# Patient Record
Sex: Female | Born: 1965 | Race: White | Hispanic: No | Marital: Married | Smoking: Current every day smoker
Health system: Southern US, Community
[De-identification: ages and names within clinical notes are randomized; demographics above are authoritative.]

## PROBLEM LIST (undated history)

## (undated) DIAGNOSIS — Z8481 Family history of carrier of genetic disease: Secondary | ICD-10-CM

## (undated) DIAGNOSIS — S0300XA Dislocation of jaw, unspecified side, initial encounter: Secondary | ICD-10-CM

## (undated) DIAGNOSIS — Z800 Family history of malignant neoplasm of digestive organs: Secondary | ICD-10-CM

## (undated) DIAGNOSIS — Z8042 Family history of malignant neoplasm of prostate: Secondary | ICD-10-CM

## (undated) DIAGNOSIS — E785 Hyperlipidemia, unspecified: Secondary | ICD-10-CM

## (undated) DIAGNOSIS — Z803 Family history of malignant neoplasm of breast: Secondary | ICD-10-CM

## (undated) HISTORY — DX: Family history of malignant neoplasm of prostate: Z80.42

## (undated) HISTORY — DX: Dislocation of jaw, unspecified side, initial encounter: S03.00XA

## (undated) HISTORY — DX: Family history of malignant neoplasm of digestive organs: Z80.0

## (undated) HISTORY — PX: KNEE SURGERY: SHX244

## (undated) HISTORY — DX: Hyperlipidemia, unspecified: E78.5

## (undated) HISTORY — PX: TUBAL LIGATION: SHX77

## (undated) HISTORY — DX: Family history of carrier of genetic disease: Z84.81

## (undated) HISTORY — DX: Family history of malignant neoplasm of breast: Z80.3

## (undated) HISTORY — PX: ENDOMETRIAL ABLATION: SHX621

---

## 2007-08-19 ENCOUNTER — Emergency Department (HOSPITAL_COMMUNITY): Admission: EM | Admit: 2007-08-19 | Discharge: 2007-08-19 | Payer: Self-pay | Admitting: Emergency Medicine

## 2010-12-05 ENCOUNTER — Inpatient Hospital Stay (INDEPENDENT_AMBULATORY_CARE_PROVIDER_SITE_OTHER)
Admission: RE | Admit: 2010-12-05 | Discharge: 2010-12-05 | Disposition: A | Discharge status: Home / Self Care | Payer: Self-pay | Source: Ambulatory Visit | Attending: Family Medicine | Admitting: Family Medicine

## 2010-12-05 DIAGNOSIS — S139XXA Sprain of joints and ligaments of unspecified parts of neck, initial encounter: Secondary | ICD-10-CM

## 2010-12-11 ENCOUNTER — Inpatient Hospital Stay (INDEPENDENT_AMBULATORY_CARE_PROVIDER_SITE_OTHER)
Admission: RE | Admit: 2010-12-11 | Discharge: 2010-12-11 | Disposition: A | Discharge status: Home / Self Care | Payer: Self-pay | Source: Ambulatory Visit | Attending: Emergency Medicine | Admitting: Emergency Medicine

## 2010-12-11 DIAGNOSIS — M26609 Unspecified temporomandibular joint disorder, unspecified side: Secondary | ICD-10-CM

## 2011-03-25 LAB — POCT URINALYSIS DIP (DEVICE)
Bilirubin Urine: NEGATIVE
Glucose, UA: NEGATIVE
Hgb urine dipstick: NEGATIVE
Ketones, ur: NEGATIVE
Nitrite: NEGATIVE
Operator id: 247071
Protein, ur: NEGATIVE
Specific Gravity, Urine: <=1.005
Urobilinogen, UA: 0.2
pH: 7

## 2011-08-07 ENCOUNTER — Other Ambulatory Visit (HOSPITAL_COMMUNITY): Payer: Self-pay | Admitting: Neurosurgery

## 2011-08-07 DIAGNOSIS — M5030 Other cervical disc degeneration, unspecified cervical region: Secondary | ICD-10-CM

## 2011-08-07 DIAGNOSIS — M542 Cervicalgia: Secondary | ICD-10-CM

## 2011-08-07 DIAGNOSIS — M5412 Radiculopathy, cervical region: Secondary | ICD-10-CM

## 2011-08-07 DIAGNOSIS — M47812 Spondylosis without myelopathy or radiculopathy, cervical region: Secondary | ICD-10-CM

## 2011-08-08 ENCOUNTER — Ambulatory Visit (HOSPITAL_COMMUNITY)
Admission: RE | Admit: 2011-08-08 | Discharge: 2011-08-08 | Disposition: A | Discharge status: Home / Self Care | Payer: 59 | Source: Ambulatory Visit | Attending: Neurosurgery | Admitting: Neurosurgery

## 2011-08-08 DIAGNOSIS — M5030 Other cervical disc degeneration, unspecified cervical region: Secondary | ICD-10-CM

## 2011-08-08 DIAGNOSIS — M47812 Spondylosis without myelopathy or radiculopathy, cervical region: Secondary | ICD-10-CM

## 2011-08-08 DIAGNOSIS — M5020 Other cervical disc displacement, unspecified cervical region: Secondary | ICD-10-CM | POA: Insufficient documentation

## 2011-08-08 DIAGNOSIS — M542 Cervicalgia: Secondary | ICD-10-CM

## 2011-08-08 DIAGNOSIS — M5412 Radiculopathy, cervical region: Secondary | ICD-10-CM

## 2012-10-12 ENCOUNTER — Ambulatory Visit: Payer: Self-pay | Admitting: Emergency Medicine

## 2012-10-14 ENCOUNTER — Emergency Department: Payer: Self-pay | Admitting: Emergency Medicine

## 2012-10-14 LAB — CBC WITH DIFFERENTIAL/PLATELET
Basophil #: 0 10*3/uL (ref 0.0–0.1)
Basophil %: 0.2 %
Eosinophil #: 0.1 10*3/uL (ref 0.0–0.7)
Eosinophil %: 0.8 %
HCT: 43.3 % (ref 35.0–47.0)
HGB: 14.8 g/dL (ref 12.0–16.0)
Lymphocyte #: 0.9 10*3/uL — ABNORMAL LOW (ref 1.0–3.6)
Lymphocyte %: 10.1 %
MCH: 31.3 pg (ref 26.0–34.0)
MCHC: 34.1 g/dL (ref 32.0–36.0)
MCV: 92 fL (ref 80–100)
Monocyte #: 0.9 x10 3/mm (ref 0.2–0.9)
Monocyte %: 9.7 %
Neutrophil #: 7.3 10*3/uL — ABNORMAL HIGH (ref 1.4–6.5)
Neutrophil %: 79.2 %
Platelet: 298 10*3/uL (ref 150–440)
RBC: 4.72 10*6/uL (ref 3.80–5.20)
RDW: 12.8 % (ref 11.5–14.5)
WBC: 9.3 10*3/uL (ref 3.6–11.0)

## 2012-10-14 LAB — BASIC METABOLIC PANEL
Anion Gap: 8 (ref 7–16)
BUN: 5 mg/dL — ABNORMAL LOW (ref 7–18)
Calcium, Total: 8.9 mg/dL (ref 8.5–10.1)
Chloride: 103 mmol/L (ref 98–107)
Co2: 21 mmol/L (ref 21–32)
Creatinine: 0.77 mg/dL (ref 0.60–1.30)
EGFR (African American): >60
EGFR (Non-African Amer.): >60
Glucose: 90 mg/dL (ref 65–99)
Osmolality: 261 (ref 275–301)
Potassium: 4.1 mmol/L (ref 3.5–5.1)
Sodium: 132 mmol/L — ABNORMAL LOW (ref 136–145)

## 2015-08-30 LAB — HM PAP SMEAR: HM Pap smear: NEGATIVE

## 2015-12-01 DIAGNOSIS — Z0000 Encounter for general adult medical examination without abnormal findings: Secondary | ICD-10-CM | POA: Diagnosis not present

## 2015-12-08 DIAGNOSIS — E669 Obesity, unspecified: Secondary | ICD-10-CM | POA: Diagnosis not present

## 2015-12-08 DIAGNOSIS — E785 Hyperlipidemia, unspecified: Secondary | ICD-10-CM | POA: Diagnosis not present

## 2015-12-08 DIAGNOSIS — N920 Excessive and frequent menstruation with regular cycle: Secondary | ICD-10-CM | POA: Diagnosis not present

## 2015-12-08 DIAGNOSIS — Z0000 Encounter for general adult medical examination without abnormal findings: Secondary | ICD-10-CM | POA: Diagnosis not present

## 2015-12-08 DIAGNOSIS — Z6835 Body mass index (BMI) 35.0-35.9, adult: Secondary | ICD-10-CM | POA: Diagnosis not present

## 2015-12-08 DIAGNOSIS — Z23 Encounter for immunization: Secondary | ICD-10-CM | POA: Diagnosis not present

## 2015-12-21 DIAGNOSIS — E785 Hyperlipidemia, unspecified: Secondary | ICD-10-CM | POA: Diagnosis not present

## 2015-12-22 DIAGNOSIS — Z1231 Encounter for screening mammogram for malignant neoplasm of breast: Secondary | ICD-10-CM | POA: Diagnosis not present

## 2016-01-17 DIAGNOSIS — N920 Excessive and frequent menstruation with regular cycle: Secondary | ICD-10-CM | POA: Diagnosis not present

## 2016-01-17 DIAGNOSIS — E669 Obesity, unspecified: Secondary | ICD-10-CM | POA: Diagnosis not present

## 2016-01-17 DIAGNOSIS — E785 Hyperlipidemia, unspecified: Secondary | ICD-10-CM | POA: Diagnosis not present

## 2016-01-17 DIAGNOSIS — Z720 Tobacco use: Secondary | ICD-10-CM | POA: Diagnosis not present

## 2016-01-19 DIAGNOSIS — N945 Secondary dysmenorrhea: Secondary | ICD-10-CM | POA: Diagnosis not present

## 2016-01-19 DIAGNOSIS — D251 Intramural leiomyoma of uterus: Secondary | ICD-10-CM | POA: Diagnosis not present

## 2016-05-28 DIAGNOSIS — R208 Other disturbances of skin sensation: Secondary | ICD-10-CM | POA: Diagnosis not present

## 2016-05-28 DIAGNOSIS — M25562 Pain in left knee: Secondary | ICD-10-CM | POA: Diagnosis not present

## 2016-06-17 DIAGNOSIS — M25562 Pain in left knee: Secondary | ICD-10-CM | POA: Diagnosis not present

## 2017-04-27 DIAGNOSIS — M2669 Other specified disorders of temporomandibular joint: Secondary | ICD-10-CM | POA: Diagnosis not present

## 2017-05-21 ENCOUNTER — Ambulatory Visit: Payer: Self-pay | Admitting: Primary Care

## 2017-06-16 ENCOUNTER — Ambulatory Visit: Payer: Self-pay | Admitting: Primary Care

## 2017-06-26 ENCOUNTER — Ambulatory Visit (INDEPENDENT_AMBULATORY_CARE_PROVIDER_SITE_OTHER): Payer: BLUE CROSS/BLUE SHIELD | Admitting: Primary Care

## 2017-06-26 ENCOUNTER — Encounter: Payer: Self-pay | Admitting: Primary Care

## 2017-06-26 VITALS — BP 122/82 | HR 86 | Temp 98.0°F | Ht 66.0 in | Wt 226.0 lb

## 2017-06-26 DIAGNOSIS — M26609 Unspecified temporomandibular joint disorder, unspecified side: Secondary | ICD-10-CM | POA: Diagnosis not present

## 2017-06-26 DIAGNOSIS — E785 Hyperlipidemia, unspecified: Secondary | ICD-10-CM | POA: Insufficient documentation

## 2017-06-26 MED ORDER — GABAPENTIN 300 MG PO CAPS: 300.0000 mg | ORAL_CAPSULE | Freq: Every day | ORAL | 0 refills | Status: DC

## 2017-06-26 MED ORDER — BACLOFEN 10 MG PO TABS: 5.0000 mg | ORAL_TABLET | Freq: Every day | ORAL | 0 refills | Status: DC

## 2017-06-26 NOTE — Assessment & Plan Note (Signed)
Will complete lipids with upcoming CPE as she is not fasting today.

## 2017-06-26 NOTE — Patient Instructions (Signed)
I sent refills for gabapentin and baclofen to your pharmacy.  Please follow up with the dentist as discussed.  Please schedule a physical with me at your convenience in 2019. You may also schedule a lab only appointment 3-4 days prior. We will discuss your lab results in detail during your physical.  It was a pleasure to meet you today! Please don't hesitate to call or message me with any questions. Welcome to [facility]!

## 2017-06-26 NOTE — Assessment & Plan Note (Signed)
Diagnosed years ago. Recommend she get established with a dentist and/or oral surgeon. She doesn't wish to pursue surgical intervention at this time.  Will refill baclofen and gabapentin for now until she can get established with a local dentist.

## 2017-06-26 NOTE — Progress Notes (Signed)
Subjective:    Patient ID: Brandi Cochran, female    DOB: 10-03-1965, 51 y.o.   MRN: [id number]  HPI  Brandi Cochran is a 51 year old female who presents today to establish care and discuss the problems mentioned below. Will obtain old records. Her last physical was one year ago.   1) TMJ: Diagnosed in 2005, by her dentist. She once had an appliance made for her TMJ. Pain is located to the right jaw, around her ear with radiation to her cheek. She also reports numbness to the right lateral side of her face. She's been grinding her teeth since age 34. She is wearing an OTC appliance at this time.  She presented to the [facility] in November 2018 due to increased pain and an inability to open her mouth. She was diagnosed with TMJ and was prescribed gabapentin 300 mg HS and baclofen 10 mg HS. She ran out of her baclofen, still has gabapentin. She's noticed significant improvement on both medications.   2) Hyperlipidemia: Diagnosed in 2017. Underwent carotid ultrasound for a bruit that was noticed on exam. Ultrasound was clear. She is not currently managed on medication. She is not fasting today.   Review of Systems  Constitutional: Negative for fever.  HENT: Negative for facial swelling.        Jaw pain  Respiratory: Negative for shortness of breath.   Cardiovascular: Negative for chest pain.  Neurological: Negative for headaches.       Past Medical History:  Diagnosis Date  . Hyperlipidemia   . TMJ (dislocation of temporomandibular joint)      Social History   Socioeconomic History  . Marital status: Divorced    Spouse name: Not on file  . Number of children: Not on file  . Years of education: Not on file  . Highest education level: Not on file  Social Needs  . Financial resource strain: Not on file  . Food insecurity - worry: Not on file  . Food insecurity - inability: Not on file  . Transportation needs - medical: Not on file  . Transportation needs -  non-medical: Not on file  Occupational History  . Not on file  Tobacco Use  . Smoking status: Current Every Day Smoker  . Smokeless tobacco: Never Used  Substance and Sexual Activity  . Alcohol use: No    Frequency: Never  . Drug use: Not on file  . Sexual activity: Not on file  Other Topics Concern  . Not on file  Social History Narrative   Married.   4 children, one grandchildren.   Works as a Engineer, petroleum.    Enjoys doing puzzles, playing computer games, aromatherapy.      Family History  Problem Relation Age of Onset  . Breast cancer Mother 60  . Hypertension Father   . Heart attack Father     No Known Allergies  No current outpatient medications on file prior to visit.   No current facility-administered medications on file prior to visit.     BP 122/82   Pulse 86   Temp 98 F (36.7 C) (Oral)   Ht 5\' 6"  (1.676 m)   Wt 226 lb (102.5 kg)   SpO2 98%   BMI 36.48 kg/m    Objective:   Physical Exam  Constitutional: She appears well-nourished.  Neck: Neck supple.  Cardiovascular: Normal rate and regular rhythm.  Pulmonary/Chest: Effort normal and breath sounds normal.  Skin: Skin is warm  and dry.  Psychiatric: She has a normal mood and affect.          Assessment & Plan:

## 2017-07-10 ENCOUNTER — Other Ambulatory Visit: Payer: Self-pay | Admitting: Primary Care

## 2017-07-10 DIAGNOSIS — E785 Hyperlipidemia, unspecified: Secondary | ICD-10-CM

## 2017-07-15 ENCOUNTER — Other Ambulatory Visit (INDEPENDENT_AMBULATORY_CARE_PROVIDER_SITE_OTHER): Payer: BLUE CROSS/BLUE SHIELD

## 2017-07-15 DIAGNOSIS — E785 Hyperlipidemia, unspecified: Secondary | ICD-10-CM

## 2017-07-15 LAB — LIPID PANEL
Cholesterol: 235 mg/dL — ABNORMAL HIGH (ref 0–200)
HDL: 48 mg/dL (ref >39.00)
LDL Cholesterol: 160 mg/dL — ABNORMAL HIGH (ref 0–99)
NonHDL: 186.93
Total CHOL/HDL Ratio: 5
Triglycerides: 137 mg/dL (ref 0.0–149.0)
VLDL: 27.4 mg/dL (ref 0.0–40.0)

## 2017-07-15 LAB — COMPREHENSIVE METABOLIC PANEL
ALT: 15 U/L (ref 0–35)
AST: 17 U/L (ref 0–37)
Albumin: 4.1 g/dL (ref 3.5–5.2)
Alkaline Phosphatase: 99 U/L (ref 39–117)
BUN: 8 mg/dL (ref 6–23)
CO2: 27 mEq/L (ref 19–32)
Calcium: 9 mg/dL (ref 8.4–10.5)
Chloride: 104 mEq/L (ref 96–112)
Creatinine, Ser: 0.63 mg/dL (ref 0.40–1.20)
GFR: 105.67 mL/min (ref >60.00)
Glucose, Bld: 90 mg/dL (ref 70–99)
Potassium: 4.3 mEq/L (ref 3.5–5.1)
Sodium: 137 mEq/L (ref 135–145)
Total Bilirubin: 0.6 mg/dL (ref 0.2–1.2)
Total Protein: 7.1 g/dL (ref 6.0–8.3)

## 2017-07-15 LAB — HEMOGLOBIN A1C: Hgb A1c MFr Bld: 5.7 % (ref 4.6–6.5)

## 2017-07-18 ENCOUNTER — Encounter: Payer: Self-pay | Admitting: Primary Care

## 2017-07-18 ENCOUNTER — Ambulatory Visit (INDEPENDENT_AMBULATORY_CARE_PROVIDER_SITE_OTHER): Payer: BLUE CROSS/BLUE SHIELD | Admitting: Primary Care

## 2017-07-18 VITALS — BP 120/78 | HR 96 | Temp 98.1°F | Ht 66.0 in | Wt 224.8 lb

## 2017-07-18 DIAGNOSIS — E785 Hyperlipidemia, unspecified: Secondary | ICD-10-CM

## 2017-07-18 DIAGNOSIS — Z1231 Encounter for screening mammogram for malignant neoplasm of breast: Secondary | ICD-10-CM | POA: Diagnosis not present

## 2017-07-18 DIAGNOSIS — Z1239 Encounter for other screening for malignant neoplasm of breast: Secondary | ICD-10-CM

## 2017-07-18 DIAGNOSIS — R7303 Prediabetes: Secondary | ICD-10-CM | POA: Diagnosis not present

## 2017-07-18 DIAGNOSIS — Z1211 Encounter for screening for malignant neoplasm of colon: Secondary | ICD-10-CM

## 2017-07-18 DIAGNOSIS — Z0000 Encounter for general adult medical examination without abnormal findings: Secondary | ICD-10-CM | POA: Diagnosis not present

## 2017-07-18 NOTE — Assessment & Plan Note (Signed)
Td UTD per patient, declines influenza vaccination. Mammogram due, pending. Pap UTD per patient. Colonoscopy due, referral placed to GI. Discussed the importance of a healthy diet and regular exercise in order for weight loss, and to reduce the risk of any potential medical problems. Exam unremarkable. Labs with hyperlipemia and prediabetes, discussed this in great detail. Follow up in 1 year.

## 2017-07-18 NOTE — Assessment & Plan Note (Signed)
Recent A1C of 5.7.  Will have her work on diet and regular exercise. Repeat in 3 months.

## 2017-07-18 NOTE — Assessment & Plan Note (Signed)
Recent lipid panel with TC of 235, LDL of 160.  Strongly recommended diet changes and to start exercising. Repeat lipids in 3 months after diet and exercise.

## 2017-07-18 NOTE — Patient Instructions (Addendum)
Call the [facility] to schedule your mammogram.  Start exercising. You should be getting 150 minutes of moderate intensity exercise weekly.  Increase consumption of vegetables, fruit, whole grains, lean protein.  Ensure you are consuming 64 ounces of water daily.  You will be contacted regarding your referral to GI for the colonoscopy.  Please let us know if you have not been contacted within one week.   Schedule a lab only appointment to repeat your labs in 3 months. Make sure you come fasting 8 hours prior.  Follow up in 1 year for your annual exam or sooner if needed.  It was a pleasure to see you today!   Preventive Care 40-64 Years, Female Preventive care refers to lifestyle choices and visits with your health care provider that can promote health and wellness. What does preventive care include?  A yearly physical exam. This is also called an annual well check.  Dental exams once or twice a year.  Routine eye exams. Ask your health care provider how often you should have your eyes checked.  Personal lifestyle choices, including: ? Daily care of your teeth and gums. ? Regular physical activity. ? Eating a healthy diet. ? Avoiding tobacco and drug use. ? Limiting alcohol use. ? Practicing safe sex. ? Taking low-dose aspirin daily starting at age 30. ? Taking vitamin and mineral supplements as recommended by your health care provider. What happens during an annual well check? The services and screenings done by your health care provider during your annual well check will depend on your age, overall health, lifestyle risk factors, and family history of disease. Counseling Your health care provider may ask you questions about your:  Alcohol use.  Tobacco use.  Drug use.  Emotional well-being.  Home and relationship well-being.  Sexual activity.  Eating habits.  Work and work Statistician.  Method of birth control.  Menstrual cycle.  Pregnancy  history.  Screening You may have the following tests or measurements:  Height, weight, and BMI.  Blood pressure.  Lipid and cholesterol levels. These may be checked every 5 years, or more frequently if you are over 62 years old.  Skin check.  Lung cancer screening. You may have this screening every year starting at age 20 if you have a 30-pack-year history of smoking and currently smoke or have quit within the past 15 years.  Fecal occult blood test (FOBT) of the stool. You may have this test every year starting at age 3.  Flexible sigmoidoscopy or colonoscopy. You may have a sigmoidoscopy every 5 years or a colonoscopy every 10 years starting at age 15.  Hepatitis C blood test.  Hepatitis B blood test.  Sexually transmitted disease (STD) testing.  Diabetes screening. This is done by checking your blood sugar (glucose) after you have not eaten for a while (fasting). You may have this done every 1-3 years.  Mammogram. This may be done every 1-2 years. Talk to your health care provider about when you should start having regular mammograms. This may depend on whether you have a family history of breast cancer.  BRCA-related cancer screening. This may be done if you have a family history of breast, ovarian, tubal, or peritoneal cancers.  Pelvic exam and Pap test. This may be done every 3 years starting at age 60. Starting at age 74, this may be done every 5 years if you have a Pap test in combination with an HPV test.  Bone density scan. This is done to  screen for osteoporosis. You may have this scan if you are at high risk for osteoporosis.  Discuss your test results, treatment options, and if necessary, the need for more tests with your health care provider. Vaccines Your health care provider may recommend certain vaccines, such as:  Influenza vaccine. This is recommended every year.  Tetanus, diphtheria, and acellular pertussis (Tdap, Td) vaccine. You may need a Td booster  every 10 years.  Varicella vaccine. You may need this if you have not been vaccinated.  Zoster vaccine. You may need this after age 31.  Measles, mumps, and rubella (MMR) vaccine. You may need at least one dose of MMR if you were born in 1957 or later. You may also need a second dose.  Pneumococcal 13-valent conjugate (PCV13) vaccine. You may need this if you have certain conditions and were not previously vaccinated.  Pneumococcal polysaccharide (PPSV23) vaccine. You may need one or two doses if you smoke cigarettes or if you have certain conditions.  Meningococcal vaccine. You may need this if you have certain conditions.  Hepatitis A vaccine. You may need this if you have certain conditions or if you travel or work in places where you may be exposed to hepatitis A.  Hepatitis B vaccine. You may need this if you have certain conditions or if you travel or work in places where you may be exposed to hepatitis B.  Haemophilus influenzae type b (Hib) vaccine. You may need this if you have certain conditions.  Talk to your health care provider about which screenings and vaccines you need and how often you need them. This information is not intended to replace advice given to you by your health care provider. Make sure you discuss any questions you have with your health care provider. Document Released: 07/14/2015 Document Revised: 03/06/2016 Document Reviewed: 04/18/2015 Elsevier Interactive Patient Education  [employer].

## 2017-07-18 NOTE — Progress Notes (Signed)
Subjective:    Patient ID: Brandi Cochran, female    DOB: [birth year], 52 y.o.   MRN: [id number]  HPI  [name] is a 52 year old female who presents today for complete physical.  Immunizations: -Tetanus: Completed within 10 years. -Influenza: Declines  Diet: She endorses a poor Breakfast: Coffee Lunch: Cooked chicken, tomato's, onions, bread, salad Dinner: Same as above Snacks: None Desserts: Occasionally  Beverages: Coffee, Water with flavor, Green tea with honey  Exercise: She does not currently exercise Eye exam: Completed in 2018 Dental exam: In process Colonoscopy: Due, never completed Pap Smear: Completed in 2017 Mammogram: Due.   Review of Systems  Constitutional: Negative for unexpected weight change.  HENT: Negative for rhinorrhea.        Right jaw pain, chronic  Respiratory: Negative for cough and shortness of breath.   Cardiovascular: Negative for chest pain.  Gastrointestinal: Negative for constipation and diarrhea.  Genitourinary: Negative for difficulty urinating and menstrual problem.  Musculoskeletal: Negative for arthralgias and myalgias.  Skin: Negative for rash.  Allergic/Immunologic: Negative for environmental allergies.  Neurological: Negative for dizziness, numbness and headaches.  Psychiatric/Behavioral:       Denies concerns for anxiety and depression       Past Medical History:  Diagnosis Date  . Hyperlipidemia   . TMJ (dislocation of temporomandibular joint)      Social History   Socioeconomic History  . Marital status: Divorced    Spouse name: Not on file  . Number of children: Not on file  . Years of education: Not on file  . Highest education level: Not on file  Social Needs  . Financial resource strain: Not on file  . Food insecurity - worry: Not on file  . Food insecurity - inability: Not on file  . Transportation needs - medical: Not on file  . Transportation needs - non-medical: Not on file  Occupational  History  . Not on file  Tobacco Use  . Smoking status: Current Every Day Smoker  . Smokeless tobacco: Never Used  Substance and Sexual Activity  . Alcohol use: No    Frequency: Never  . Drug use: Not on file  . Sexual activity: Not on file  Other Topics Concern  . Not on file  Social History Narrative   Married.   4 children, one grandchildren.   Works as a Engineer, petroleum.    Enjoys doing puzzles, playing computer games, aromatherapy.       Family History  Problem Relation Age of Onset  . Breast cancer Mother 9  . Hypertension Father   . Heart attack Father     No Known Allergies  Current Outpatient Medications on File Prior to Visit  Medication Sig Dispense Refill  . baclofen (LIORESAL) 10 MG tablet Take 0.5 tablets (5 mg total) by mouth at bedtime. 45 each 0  . cetirizine (ZYRTEC) 10 MG tablet Take 10 mg by mouth daily.    Marland Kitchen gabapentin (NEURONTIN) 300 MG capsule Take 1 capsule (300 mg total) by mouth at bedtime. 90 capsule 0   No current facility-administered medications on file prior to visit.     BP 120/78   Pulse 96   Temp 98.1 F (36.7 C) (Oral)   Ht 5\' 6"  (1.676 m)   Wt 224 lb 12.8 oz (102 kg)   SpO2 98%   BMI 36.28 kg/m    Objective:   Physical Exam  Constitutional: She is oriented to person, place, and time. She  appears well-nourished.  HENT:  Right Ear: Tympanic membrane and ear canal normal.  Left Ear: Tympanic membrane and ear canal normal.  Nose: Nose normal.  Mouth/Throat: Oropharynx is clear and moist.  Stiffness to right jaw  Eyes: Conjunctivae and EOM are normal. Pupils are equal, round, and reactive to light.  Neck: Neck supple. No thyromegaly present.  Cardiovascular: Normal rate and regular rhythm.  No murmur heard. Pulmonary/Chest: Effort normal and breath sounds normal. She has no rales.  Abdominal: Soft. Bowel sounds are normal. There is no tenderness.  Musculoskeletal: Normal range of motion.  Lymphadenopathy:    She has  no cervical adenopathy.  Neurological: She is alert and oriented to person, place, and time. She has normal reflexes. No cranial nerve deficit.  Skin: Skin is warm and dry. No rash noted.  Psychiatric: She has a normal mood and affect.          Assessment & Plan:

## 2017-09-19 ENCOUNTER — Other Ambulatory Visit: Payer: Self-pay

## 2017-09-19 ENCOUNTER — Other Ambulatory Visit: Payer: Self-pay | Admitting: Primary Care

## 2017-09-19 ENCOUNTER — Telehealth: Payer: Self-pay

## 2017-09-19 DIAGNOSIS — Z1211 Encounter for screening for malignant neoplasm of colon: Secondary | ICD-10-CM

## 2017-09-19 DIAGNOSIS — M26609 Unspecified temporomandibular joint disorder, unspecified side: Secondary | ICD-10-CM

## 2017-09-19 MED ORDER — PEG 3350-KCL-NA BICARB-NACL 420 G PO SOLR: 4000.0000 mL | Freq: Once | ORAL | 0 refills | Status: AC

## 2017-09-19 NOTE — Telephone Encounter (Signed)
Gastroenterology Pre-Procedure Review  Request Date: 4/19 Requesting Physician: Dr. Bonna Gains  PATIENT REVIEW QUESTIONS: The patient responded to the following health history questions as indicated:    1. Are you having any GI issues? no 2. Do you have a personal history of Polyps? no 3. Do you have a family history of Colon Cancer or Polyps? no 4. Diabetes Mellitus? no 5. Joint replacements in the past 12 months?no 6. Major health problems in the past 3 months?no 7. Any artificial heart valves, MVP, or defibrillator?no    MEDICATIONS & ALLERGIES:    Patient reports the following regarding taking any anticoagulation/antiplatelet therapy:   Plavix, Coumadin, Eliquis, Xarelto, Lovenox, Pradaxa, Brilinta, or Effient? no Aspirin? no  Patient confirms/reports the following medications:  Current Outpatient Medications  Medication Sig Dispense Refill  . baclofen (LIORESAL) 10 MG tablet Take 0.5 tablets (5 mg total) by mouth at bedtime. 45 each 0  . cetirizine (ZYRTEC) 10 MG tablet Take 10 mg by mouth daily.    Marland Kitchen gabapentin (NEURONTIN) 300 MG capsule Take 1 capsule (300 mg total) by mouth at bedtime. 90 capsule 0   No current facility-administered medications for this visit.     Patient confirms/reports the following allergies:  No Known Allergies  No orders of the defined types were placed in this encounter.   AUTHORIZATION INFORMATION Primary Insurance: 1D#: Group #:  Secondary Insurance: 1D#: Group #:  SCHEDULE INFORMATION: Date: 4/19 Time: Location: [facility]

## 2017-09-19 NOTE — Telephone Encounter (Signed)
Has she gotten established with a dentist for her TMJ? Is she still taking both gabapentin and baclofen regularly?

## 2017-09-24 NOTE — Telephone Encounter (Signed)
Per DPR, left detail message of Kate's comments for patient to call back. 

## 2017-09-25 NOTE — Telephone Encounter (Signed)
Per DPR, left detail message of Kate's comments for patient to call back. 

## 2017-10-16 ENCOUNTER — Other Ambulatory Visit (INDEPENDENT_AMBULATORY_CARE_PROVIDER_SITE_OTHER): Payer: BLUE CROSS/BLUE SHIELD

## 2017-10-16 DIAGNOSIS — E785 Hyperlipidemia, unspecified: Secondary | ICD-10-CM

## 2017-10-16 DIAGNOSIS — R7303 Prediabetes: Secondary | ICD-10-CM | POA: Diagnosis not present

## 2017-10-16 LAB — LIPID PANEL
Cholesterol: 237 mg/dL — ABNORMAL HIGH (ref 0–200)
HDL: 43.2 mg/dL (ref >39.00)
LDL Cholesterol: 160 mg/dL — ABNORMAL HIGH (ref 0–99)
NonHDL: 194.23
Total CHOL/HDL Ratio: 5
Triglycerides: 171 mg/dL — ABNORMAL HIGH (ref 0.0–149.0)
VLDL: 34.2 mg/dL (ref 0.0–40.0)

## 2017-10-16 LAB — HEMOGLOBIN A1C: Hgb A1c MFr Bld: 5.6 % (ref 4.6–6.5)

## 2017-10-17 ENCOUNTER — Ambulatory Visit: Payer: BLUE CROSS/BLUE SHIELD | Admitting: Anesthesiology

## 2017-10-17 ENCOUNTER — Encounter
Admission: RE | Disposition: A | Discharge status: Home / Self Care | Payer: Self-pay | Source: Ambulatory Visit | Attending: Gastroenterology

## 2017-10-17 ENCOUNTER — Ambulatory Visit
Admission: RE | Admit: 2017-10-17 | Discharge: 2017-10-17 | Disposition: A | Discharge status: Home / Self Care | Payer: BLUE CROSS/BLUE SHIELD | Source: Ambulatory Visit | Attending: Gastroenterology | Admitting: Gastroenterology

## 2017-10-17 ENCOUNTER — Encounter: Payer: Self-pay | Admitting: Anesthesiology

## 2017-10-17 ENCOUNTER — Other Ambulatory Visit: Payer: Self-pay | Admitting: Primary Care

## 2017-10-17 DIAGNOSIS — Z8249 Family history of ischemic heart disease and other diseases of the circulatory system: Secondary | ICD-10-CM | POA: Diagnosis not present

## 2017-10-17 DIAGNOSIS — D120 Benign neoplasm of cecum: Secondary | ICD-10-CM | POA: Diagnosis not present

## 2017-10-17 DIAGNOSIS — F17200 Nicotine dependence, unspecified, uncomplicated: Secondary | ICD-10-CM | POA: Diagnosis not present

## 2017-10-17 DIAGNOSIS — D123 Benign neoplasm of transverse colon: Secondary | ICD-10-CM | POA: Diagnosis not present

## 2017-10-17 DIAGNOSIS — K5790 Diverticulosis of intestine, part unspecified, without perforation or abscess without bleeding: Secondary | ICD-10-CM | POA: Diagnosis not present

## 2017-10-17 DIAGNOSIS — K635 Polyp of colon: Secondary | ICD-10-CM | POA: Diagnosis not present

## 2017-10-17 DIAGNOSIS — E785 Hyperlipidemia, unspecified: Secondary | ICD-10-CM | POA: Diagnosis not present

## 2017-10-17 DIAGNOSIS — D128 Benign neoplasm of rectum: Secondary | ICD-10-CM | POA: Insufficient documentation

## 2017-10-17 DIAGNOSIS — K5730 Diverticulosis of large intestine without perforation or abscess without bleeding: Secondary | ICD-10-CM

## 2017-10-17 DIAGNOSIS — Z1211 Encounter for screening for malignant neoplasm of colon: Secondary | ICD-10-CM | POA: Diagnosis not present

## 2017-10-17 DIAGNOSIS — E782 Mixed hyperlipidemia: Secondary | ICD-10-CM

## 2017-10-17 DIAGNOSIS — K621 Rectal polyp: Secondary | ICD-10-CM | POA: Diagnosis not present

## 2017-10-17 DIAGNOSIS — Z79899 Other long term (current) drug therapy: Secondary | ICD-10-CM | POA: Insufficient documentation

## 2017-10-17 HISTORY — PX: COLONOSCOPY WITH PROPOFOL: SHX5780

## 2017-10-17 SURGERY — COLONOSCOPY WITH PROPOFOL
Anesthesia: General

## 2017-10-17 MED ORDER — MIDAZOLAM HCL 2 MG/2ML IJ SOLN
INTRAMUSCULAR | Status: DC | PRN
  Administered 2017-10-17 (×2): 1 mg via INTRAVENOUS

## 2017-10-17 MED ORDER — PROPOFOL 10 MG/ML IV BOLUS
INTRAVENOUS | Status: DC | PRN
  Administered 2017-10-17: 20 mg via INTRAVENOUS
  Administered 2017-10-17: 80 mg via INTRAVENOUS

## 2017-10-17 MED ORDER — PROPOFOL 500 MG/50ML IV EMUL
INTRAVENOUS | Status: DC | PRN
  Administered 2017-10-17: 200 ug/kg/min via INTRAVENOUS

## 2017-10-17 MED ORDER — LIDOCAINE HCL (PF) 2 % IJ SOLN
INTRAMUSCULAR | Status: AC
  Filled 2017-10-17: qty 10

## 2017-10-17 MED ORDER — PROPOFOL 10 MG/ML IV BOLUS
INTRAVENOUS | Status: AC
  Filled 2017-10-17: qty 20

## 2017-10-17 MED ORDER — SODIUM CHLORIDE 0.9 % IV SOLN
INTRAVENOUS | Status: DC
  Administered 2017-10-17: 1000 mL via INTRAVENOUS

## 2017-10-17 MED ORDER — MIDAZOLAM HCL 2 MG/2ML IJ SOLN
INTRAMUSCULAR | Status: AC
  Filled 2017-10-17: qty 2

## 2017-10-17 MED ORDER — LIDOCAINE HCL (CARDIAC) PF 100 MG/5ML IV SOSY
PREFILLED_SYRINGE | INTRAVENOUS | Status: DC | PRN
  Administered 2017-10-17: 50 mg via INTRAVENOUS

## 2017-10-17 NOTE — Op Note (Signed)
[facility]astroenterology Patient Name: Brandi Cochran Procedure Date: 10/17/2017 9:50 AM MRN: [id number] Account #: [id number] Date of Birth: 05-Jun-1966 Admit Type: Outpatient Age: 52 Room: [facility] ENDO ROOM 2 Gender: Female Note Status: Finalized Procedure:            Colonoscopy Indications:          Screening for colorectal malignant neoplasm, This is                        the patient's first colonoscopy Providers:            Lin Landsman MD, MD Referring MD:         Pleas Koch (Referring MD) Medicines:            Monitored Anesthesia Care Complications:        No immediate complications. Estimated blood loss: None. Procedure:            Pre-Anesthesia Assessment:                       - Prior to the procedure, a History and Physical was                        performed, and patient medications and allergies were                        reviewed. The patient is competent. The risks and                        benefits of the procedure and the sedation options and                        risks were discussed with the patient. All questions                        were answered and informed consent was obtained.                        Patient identification and proposed procedure were                        verified by the physician, the nurse, the                        anesthesiologist, the anesthetist and the technician in                        the pre-procedure area in the procedure room in the                        endoscopy suite. Mental Status Examination: alert and                        oriented. Airway Examination: normal oropharyngeal                        airway and neck mobility. Respiratory Examination:                        clear to auscultation. CV Examination: normal.  Prophylactic Antibiotics: The patient does not require                        prophylactic antibiotics. Prior Anticoagulants: The                patient has taken no previous anticoagulant or                        antiplatelet agents. ASA Grade Assessment: II - A                        patient with mild systemic disease. After reviewing the                        risks and benefits, the patient was deemed in                        satisfactory condition to undergo the procedure. The                        anesthesia plan was to use monitored anesthesia care                        (MAC). Immediately prior to administration of                        medications, the patient was re-assessed for adequacy                        to receive sedatives. The heart rate, respiratory rate,                        oxygen saturations, blood pressure, adequacy of                        pulmonary ventilation, and response to care were                        monitored throughout the procedure. The physical status                        of the patient was re-assessed after the procedure.                       After obtaining informed consent, the colonoscope was                        passed under direct vision. Throughout the procedure,                        the patient's blood pressure, pulse, and oxygen                        saturations were monitored continuously. The                        Colonoscope was introduced through the anus and                        advanced to the the cecum, identified by appendiceal  orifice and ileocecal valve. The colonoscopy was                        performed without difficulty. The patient tolerated the                        procedure well. The quality of the bowel preparation                        was evaluated using the BBPS [facility] Bowel Preparation                        Scale) with scores of: Right Colon = 3, Transverse                        Colon = 3 and Left Colon = 3 (entire mucosa seen well                        with no residual staining, small fragments of stool  or                        opaque liquid). The total BBPS score equals 9. Findings:      The perianal and digital rectal examinations were normal. Pertinent       negatives include normal sphincter tone and no palpable rectal lesions.      Two sessile polyps were found in the transverse colon and cecum. The       polyps were 4 to 6 mm in size. These polyps were removed with a cold       snare. Resection and retrieval were complete.      A 8 mm polyp was found in the rectum. The polyp was semi-pedunculated.       The polyp was removed with a hot snare. Resection and retrieval were       complete.      A few small-mouthed diverticula were found in the sigmoid colon. There       was no evidence of diverticular bleeding.      The retroflexed view of the distal rectum and anal verge was normal and       showed no anal or rectal abnormalities. Impression:           - Two 4 to 6 mm polyps in the transverse colon and in                        the cecum, removed with a cold snare. Resected and                        retrieved.                       - One 8 mm polyp in the rectum, removed with a hot                        snare. Resected and retrieved.                       - Moderate diverticulosis in the sigmoid colon. There  was no evidence of diverticular bleeding.                       - The distal rectum and anal verge are normal on                        retroflexion view. Recommendation:       - Discharge patient to home.                       - Resume previous diet today.                       - Continue present medications.                       - Await pathology results.                       - Repeat colonoscopy in 3 years for surveillance of                        multiple polyps. Procedure Code(s):    --- Professional ---                       [id number], Colonoscopy, flexible; with removal of tumor(s),                        polyp(s), or other lesion(s) by snare  technique Diagnosis Code(s):    --- Professional ---                       Z12.11, Encounter for screening for malignant neoplasm                        of colon                       D12.3, Benign neoplasm of transverse colon (hepatic                        flexure or splenic flexure)                       D12.0, Benign neoplasm of cecum                       K62.1, Rectal polyp                       K57.30, Diverticulosis of large intestine without                        perforation or abscess without bleeding CPT copyright 2017 American Medical Association. All rights reserved. The codes documented in this report are preliminary and upon coder review may  be revised to meet current compliance requirements. Dr. Ulyess Mort Lin Landsman MD, MD 10/17/2017 10:21:05 AM This report has been signed electronically. Number of Addenda: 0 Note Initiated On: 10/17/2017 9:50 AM Scope Withdrawal Time: 0 hours 21 minutes 4 seconds  Total Procedure Duration: 0 hours 23 minutes 53 seconds       [facility]

## 2017-10-17 NOTE — Anesthesia Post-op Follow-up Note (Signed)
Anesthesia QCDR form completed.        

## 2017-10-17 NOTE — Anesthesia Preprocedure Evaluation (Signed)
Anesthesia Evaluation  Patient identified by MRN, date of birth, ID band Patient awake    Reviewed: Allergy & Precautions, NPO status , Patient's Chart, lab work & pertinent test results, reviewed documented beta blocker date and time   Airway Mallampati: III  TM Distance: >3 FB     Dental  (+) Chipped   Pulmonary Current Smoker,           Cardiovascular      Neuro/Psych    GI/Hepatic   Endo/Other    Renal/GU      Musculoskeletal   Abdominal   Peds  Hematology   Anesthesia Other Findings   Reproductive/Obstetrics                             Anesthesia Physical Anesthesia Plan  ASA: III  Anesthesia Plan: General   Post-op Pain Management:    Induction: Intravenous  PONV Risk Score and Plan:   Airway Management Planned:   Additional Equipment:   Intra-op Plan:   Post-operative Plan:   Informed Consent: I have reviewed the patients History and Physical, chart, labs and discussed the procedure including the risks, benefits and alternatives for the proposed anesthesia with the patient or authorized representative who has indicated his/her understanding and acceptance.     Plan Discussed with: CRNA  Anesthesia Plan Comments:         Anesthesia Quick Evaluation

## 2017-10-17 NOTE — Transfer of Care (Signed)
Immediate Anesthesia Transfer of Care Note  Patient: Brandi Cochran  Procedure(s) Performed: COLONOSCOPY WITH PROPOFOL (N/A )  Patient Location: PACU and Endoscopy Unit  Anesthesia Type:General  Level of Consciousness: awake  Airway & Oxygen Therapy: Patient Spontanous Breathing  Post-op Assessment: Report given to RN  Post vital signs: stable  Last Vitals:  Vitals Value Taken Time  BP    Temp    Pulse    Resp    SpO2      Last Pain:  Vitals:   10/17/17 0814  TempSrc: Tympanic  PainSc: 4          Complications: No apparent anesthesia complications

## 2017-10-17 NOTE — Anesthesia Postprocedure Evaluation (Signed)
Anesthesia Post Note  Patient: Brandi Cochran  Procedure(s) Performed: COLONOSCOPY WITH PROPOFOL (N/A )  Patient location during evaluation: Endoscopy Anesthesia Type: General Level of consciousness: awake and alert Pain management: pain level controlled Vital Signs Assessment: post-procedure vital signs reviewed and stable Respiratory status: spontaneous breathing, nonlabored ventilation, respiratory function stable and patient connected to nasal cannula oxygen Cardiovascular status: blood pressure returned to baseline and stable Postop Assessment: no apparent nausea or vomiting Anesthetic complications: no     Last Vitals:  Vitals:   10/17/17 1034 10/17/17 1044  BP: 117/80 121/76  Pulse: 78 77  Resp: 15 18  Temp:    SpO2: 98% 100%    Last Pain:  Vitals:   10/17/17 1044  TempSrc:   PainSc: 0-No pain                 Nour Scalise S

## 2017-10-17 NOTE — H&P (Signed)
Brandi Darby, MD [address]  [place]  [place], [place] [address]  Main: [phone]  Fax: [phone] Pager: [phone]  Primary Care Physician:  Pleas Koch, NP Primary Gastroenterologist:  Dr. Cephas Cochran  Pre-Procedure History & Physical: HPI:  Brandi Cochran is a 52 y.o. female is here for an colonoscopy.   Past Medical History:  Diagnosis Date  . Hyperlipidemia   . TMJ (dislocation of temporomandibular joint)      Prior to Admission medications   Medication Sig Start Date End Date Taking? Authorizing Provider  baclofen (LIORESAL) 10 MG tablet TAKE 1/2 TABLET(5 MG) BY MOUTH AT BEDTIME 09/19/17   Pleas Koch, NP  cetirizine (ZYRTEC) 10 MG tablet Take 10 mg by mouth daily.    [provider]  gabapentin (NEURONTIN) 300 MG capsule TAKE 1 CAPSULE(300 MG) BY MOUTH AT BEDTIME 09/19/17   Pleas Koch, NP    Allergies as of 09/19/2017  . (No Known Allergies)    Family History  Problem Relation Age of Onset  . Breast cancer Mother 55  . Hypertension Father   . Heart attack Father     Social History   Socioeconomic History  . Marital status: Divorced    Spouse name: Not on file  . Number of children: Not on file  . Years of education: Not on file  . Highest education level: Not on file  Occupational History  . Not on file  Social Needs  . Financial resource strain: Not on file  . Food insecurity:    Worry: Not on file    Inability: Not on file  . Transportation needs:    Medical: Not on file    Non-medical: Not on file  Tobacco Use  . Smoking status: Current Every Day Smoker  . Smokeless tobacco: Never Used  Substance and Sexual Activity  . Alcohol use: No    Frequency: Never  . Drug use: Not on file  . Sexual activity: Not on file  Lifestyle  . Physical activity:    Days per week: Not on file    Minutes per session: Not on file  . Stress: Not on file  Relationships  . Social connections:    Talks on  phone: Not on file    Gets together: Not on file    Attends religious service: Not on file    Active member of club or organization: Not on file    Attends meetings of clubs or organizations: Not on file    Relationship status: Not on file  . Intimate partner violence:    Fear of current or ex partner: Not on file    Emotionally abused: Not on file    Physically abused: Not on file    Forced sexual activity: Not on file  Other Topics Concern  . Not on file  Social History Narrative   Married.   4 children, one grandchildren.   Works as a Engineer, [employer].    Enjoys doing puzzles, playing computer games, aromatherapy.     Review of Systems: See HPI, otherwise negative ROS  Physical Exam: BP 124/86   Pulse 78   Temp (!) 97.1 F (36.2 C) (Tympanic)   Resp 16   Ht 5\' 7"  (1.702 m)   Wt 210 lb (95.3 kg)   SpO2 100%   BMI 32.89 kg/m  General:   Alert,  pleasant and cooperative in NAD Head:  Normocephalic and atraumatic. Neck:  Supple; no masses  or thyromegaly. Lungs:  Clear throughout to auscultation.    Heart:  Regular rate and rhythm. Abdomen:  Soft, nontender and nondistended. Normal bowel sounds, without guarding, and without rebound.   Neurologic:  Alert and  oriented x4;  grossly normal neurologically.  Impression/Plan: Brandi Cochran is here for an colonoscopy to be performed for colon cancer screening  Risks, benefits, limitations, and alternatives regarding  colonoscopy have been reviewed with the patient.  Questions have been answered.  All parties agreeable.   Sherri Sear, MD  10/17/2017, 9:43 AM

## 2017-10-20 ENCOUNTER — Encounter: Payer: Self-pay | Admitting: Gastroenterology

## 2017-10-20 LAB — SURGICAL PATHOLOGY

## 2017-10-21 ENCOUNTER — Encounter: Payer: Self-pay | Admitting: *Deleted

## 2017-12-24 ENCOUNTER — Other Ambulatory Visit: Payer: Self-pay | Admitting: Primary Care

## 2017-12-24 DIAGNOSIS — M26609 Unspecified temporomandibular joint disorder, unspecified side: Secondary | ICD-10-CM

## 2017-12-24 NOTE — Telephone Encounter (Signed)
Electronic refill request Last refill Baclofen 09/19/17 #45 Last refill Gabapentin 09/19/17 #90

## 2017-12-25 MED ORDER — BACLOFEN 10 MG PO TABS: ORAL_TABLET | ORAL | 0 refills | Status: AC

## 2017-12-25 MED ORDER — GABAPENTIN 300 MG PO CAPS: ORAL_CAPSULE | ORAL | 0 refills | Status: AC

## 2017-12-25 NOTE — Telephone Encounter (Signed)
There is no sig for either medication. Can we call and see how she is taking these so I can refill while Anda Kraft is out of the office.

## 2017-12-25 NOTE — Telephone Encounter (Signed)
I have the sig on the Rx now.

## 2018-01-13 ENCOUNTER — Encounter: Payer: Self-pay | Admitting: Obstetrics and Gynecology

## 2018-01-29 ENCOUNTER — Ambulatory Visit (INDEPENDENT_AMBULATORY_CARE_PROVIDER_SITE_OTHER): Payer: BLUE CROSS/BLUE SHIELD | Admitting: Obstetrics and Gynecology

## 2018-01-29 ENCOUNTER — Other Ambulatory Visit: Payer: Self-pay | Admitting: Obstetrics and Gynecology

## 2018-01-29 ENCOUNTER — Encounter: Payer: Self-pay | Admitting: Obstetrics and Gynecology

## 2018-01-29 VITALS — BP 124/83 | HR 81 | Resp 16 | Ht 67.0 in | Wt 212.2 lb

## 2018-01-29 DIAGNOSIS — Z1239 Encounter for other screening for malignant neoplasm of breast: Secondary | ICD-10-CM

## 2018-01-29 DIAGNOSIS — Z1151 Encounter for screening for human papillomavirus (HPV): Secondary | ICD-10-CM

## 2018-01-29 DIAGNOSIS — Z124 Encounter for screening for malignant neoplasm of cervix: Secondary | ICD-10-CM | POA: Diagnosis not present

## 2018-01-29 DIAGNOSIS — Z113 Encounter for screening for infections with a predominantly sexual mode of transmission: Secondary | ICD-10-CM

## 2018-01-29 DIAGNOSIS — Z803 Family history of malignant neoplasm of breast: Secondary | ICD-10-CM | POA: Diagnosis not present

## 2018-01-29 DIAGNOSIS — N939 Abnormal uterine and vaginal bleeding, unspecified: Secondary | ICD-10-CM | POA: Diagnosis not present

## 2018-01-29 NOTE — Progress Notes (Signed)
Obstetrics and Gynecology New Patient Evaluation  Appointment Date: 01/29/2018  OBGYN Clinic: [facility]  Primary Care Provider: Pleas Koch  Referring Provider: Pleas Koch, NP  Chief Complaint:  Chief Complaint  Patient presents with  . Annual Exam    History of Present Illness: Brandi Cochran is a 52 y.o. Caucasian [id number] (No LMP recorded. Patient has had an ablation.), seen for the above chief complaint. Her past medical history is significant for endometrial ablation, ppBTL, mother passed away in her 2s from breast/ovarian cancer.   Patient had an endometrial ablation in approx 2009-2010 for heavy painful periods. She states she had it done in the office by a doctor in [place] (pt unsure of name) and that they did a scraping and ablation. Since then she's been pretty much amenhorriec but over the past few months she's had increased episodes of bleeding and pain with bleeding. She has pain off and on during the day that is llq and feels sharp and crampy and she has the same type of pain when she has qmonth bleeding (old blood, lasts for about 3 days). Before this, she never had any bleeding or pain.   No hot flashes, night sweats, vaginal dryness. No change in BMs, blood in BMs, hematuria. Has occasional discomfort with sex in that llq area.   She states she saw a gyn in the [place] area 1-2 years ago who said it was likely from fibroids and the ablation and when she goes through menopause that it should take care of her s/s. She believes she had an u/s there but doesn't believe she had a biopsy or pap; pt unsure of the office name.    Review of Systems:  as noted in the History of Present Illness.  Past Medical History:  Past Medical History:  Diagnosis Date  . Hyperlipidemia   . TMJ (dislocation of temporomandibular joint)     Past Surgical History:  Past Surgical History:  Procedure Laterality Date  . COLONOSCOPY  WITH PROPOFOL N/A 10/17/2017   Procedure: COLONOSCOPY WITH PROPOFOL;  Surgeon: Lin Landsman, MD;  Location: [facility] ENDOSCOPY;  Service: Endoscopy;  Laterality: N/A;  . KNEE SURGERY Left   . TUBAL LIGATION     postpartum    Past Obstetrical History:  OB History  Gravida Para Term Preterm AB Living  2 2 2     2   SAB TAB Ectopic Multiple Live Births          2    # Outcome Date GA Lbr Len/2nd Weight Sex Delivery Anes PTL Lv  2 Term           1 Term             Obstetric Comments  SVD x 2    Past Gynecological History: As per HPI. No h/o HRT  Social History:  Social History   Socioeconomic History  . Marital status: Divorced    Spouse name: Not on file  . Number of children: Not on file  . Years of education: Not on file  . Highest education level: Not on file  Occupational History  . Not on file  Social Needs  . Financial resource strain: Not on file  . Food insecurity:    Worry: Not on file    Inability: Not on file  . Transportation needs:    Medical: Not on file    Non-medical: Not on file  Tobacco Use  . Smoking status: Current  Every Day Smoker  . Smokeless tobacco: Never Used  Substance and Sexual Activity  . Alcohol use: No    Frequency: Never  . Drug use: Not on file  . Sexual activity: Not on file  Lifestyle  . Physical activity:    Days per week: Not on file    Minutes per session: Not on file  . Stress: Not on file  Relationships  . Social connections:    Talks on phone: Not on file    Gets together: Not on file    Attends religious service: Not on file    Active member of club or organization: Not on file    Attends meetings of clubs or organizations: Not on file    Relationship status: Not on file  . Intimate partner violence:    Fear of current or ex partner: Not on file    Emotionally abused: Not on file    Physically abused: Not on file    Forced sexual activity: Not on file  Other Topics Concern  . Not on file  Social History  Narrative   Married.   4 children, one grandchildren.   Works as a Engineer, [employer].    Enjoys doing puzzles, playing computer games, aromatherapy.     Family History:  Family History  Problem Relation Age of Onset  . Breast cancer Mother 54  . Hypertension Father   . Heart attack Father     Medications [name] had no medications administered during this visit. Current Outpatient Medications  Medication Sig Dispense Refill  . baclofen (LIORESAL) 10 MG tablet TAKE 1/2 TABLET(5 MG) BY MOUTH AT BEDTIME 45 tablet 0  . cetirizine (ZYRTEC) 10 MG tablet Take 10 mg by mouth daily.    Marland Kitchen gabapentin (NEURONTIN) 300 MG capsule TAKE 1 CAPSULE(300 MG) BY MOUTH AT BEDTIME 90 capsule 0  . acetaminophen (TYLENOL) 500 MG tablet Take 1 tablet by mouth as needed.     No current facility-administered medications for this visit.     Allergies Patient has no known allergies.   Physical Exam:  BP 124/83 (BP Location: Left Arm, Patient Position: Sitting, Cuff Size: Large)   Pulse 81   Resp 16   Ht 5\' 7"  (1.702 m)   Wt 212 lb 3.2 oz (96.3 kg)   BMI 33.24 kg/m  Body mass index is 33.24 kg/m. General appearance: Well nourished, well developed female in no acute distress.  Neck:  Supple, normal appearance, and no thyromegaly  Cardiovascular: normal s1 and s2.  No murmurs, rubs or gallops. Respiratory:  Clear to auscultation bilateral. Normal respiratory effort Abdomen: positive bowel sounds and no masses, hernias; diffusely non tender to palpation, non distended Breasts: breasts appear normal, no suspicious masses, no skin or nipple changes or axillary nodes, and normal palpation. Neuro/Psych:  Normal mood and affect.  Skin:  Warm and dry.  Lymphatic:  No inguinal lymphadenopathy.   Pelvic exam: is not limited by body habitus EGBUS: within normal limits, Vagina: within normal limits and with no blood or discharge in the vault, Cervix: normal appearing cervix without  tenderness, discharge or lesions. Uterus:  nonenlarged and non tender and Adnexa:  normal adnexa and no mass, fullness, tenderness Rectovaginal: deferred  Laboratory: none  Radiology: none  Assessment: pt stable  Plan:  1. 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 Pap smear done today and recommend pelvic u/s, basic lab work and to see her  back and try and do an endometrial biopsy. I briefly d/w her re: medical (depo provera, progestin OCPs) until menopause and surgical options (hysterectomy, attempt hysteroscopy d&c) in the OR. Will d/w pt more nv.  - Cytology - PAP - US PELVIC COMPLETE WITH TRANSVAGINAL; Future - TSH - CBC - Follicle stimulating hormone   2. FHx of breast cancer Pt declines genetic testing today. Information given. Pt also asked to schedule screening mammo; order is already in the computer.   Orders Placed This Encounter  Procedures  . US PELVIC COMPLETE WITH TRANSVAGINAL  . TSH  . CBC  . Follicle stimulating hormone    RTC after u/s   Durene Romans MD Attending [facility])

## 2018-01-29 NOTE — Progress Notes (Signed)
Last PAP: 08/30/2015  Due: 08/30/2018

## 2018-01-30 LAB — CBC
Hematocrit: 42.8 % (ref 34.0–46.6)
Hemoglobin: 14.7 g/dL (ref 11.1–15.9)
MCH: 30.9 pg (ref 26.6–33.0)
MCHC: 34.3 g/dL (ref 31.5–35.7)
MCV: 90 fL (ref 79–97)
Platelets: 326 10*3/uL (ref 150–450)
RBC: 4.75 x10E6/uL (ref 3.77–5.28)
RDW: 12.7 % (ref 12.3–15.4)
WBC: 8.9 10*3/uL (ref 3.4–10.8)

## 2018-01-30 LAB — TSH: TSH: 2.6 u[IU]/mL (ref 0.450–4.500)

## 2018-01-30 LAB — FOLLICLE STIMULATING HORMONE: FSH: 59.3 m[IU]/mL

## 2018-02-02 LAB — CYTOLOGY - PAP
Chlamydia: NEGATIVE
Diagnosis: NEGATIVE
HPV: NOT DETECTED
Neisseria Gonorrhea: NEGATIVE
Trichomonas: NEGATIVE

## 2018-02-03 ENCOUNTER — Encounter: Payer: Self-pay | Admitting: Radiology

## 2018-02-05 ENCOUNTER — Ambulatory Visit: Payer: BLUE CROSS/BLUE SHIELD

## 2018-02-16 ENCOUNTER — Encounter: Payer: Self-pay | Admitting: Radiology

## 2018-02-20 ENCOUNTER — Ambulatory Visit: Payer: BLUE CROSS/BLUE SHIELD

## 2018-02-27 ENCOUNTER — Other Ambulatory Visit: Payer: Self-pay | Admitting: Obstetrics and Gynecology

## 2018-02-27 ENCOUNTER — Ambulatory Visit
Admission: RE | Admit: 2018-02-27 | Discharge: 2018-02-27 | Disposition: A | Discharge status: Home / Self Care | Payer: BLUE CROSS/BLUE SHIELD | Source: Ambulatory Visit | Attending: Obstetrics and Gynecology | Admitting: Obstetrics and Gynecology

## 2018-02-27 DIAGNOSIS — Z1231 Encounter for screening mammogram for malignant neoplasm of breast: Secondary | ICD-10-CM | POA: Diagnosis not present

## 2018-02-27 DIAGNOSIS — Z1239 Encounter for other screening for malignant neoplasm of breast: Secondary | ICD-10-CM

## 2019-01-04 IMAGING — MG DIGITAL SCREENING BILATERAL MAMMOGRAM WITH CAD
4 series · 4 of 4 positions shown · non-contrast
Comparison: Previous exam(s).

CLINICAL DATA: Screening.

EXAM:
DIGITAL SCREENING BILATERAL MAMMOGRAM WITH CAD

[L CC]
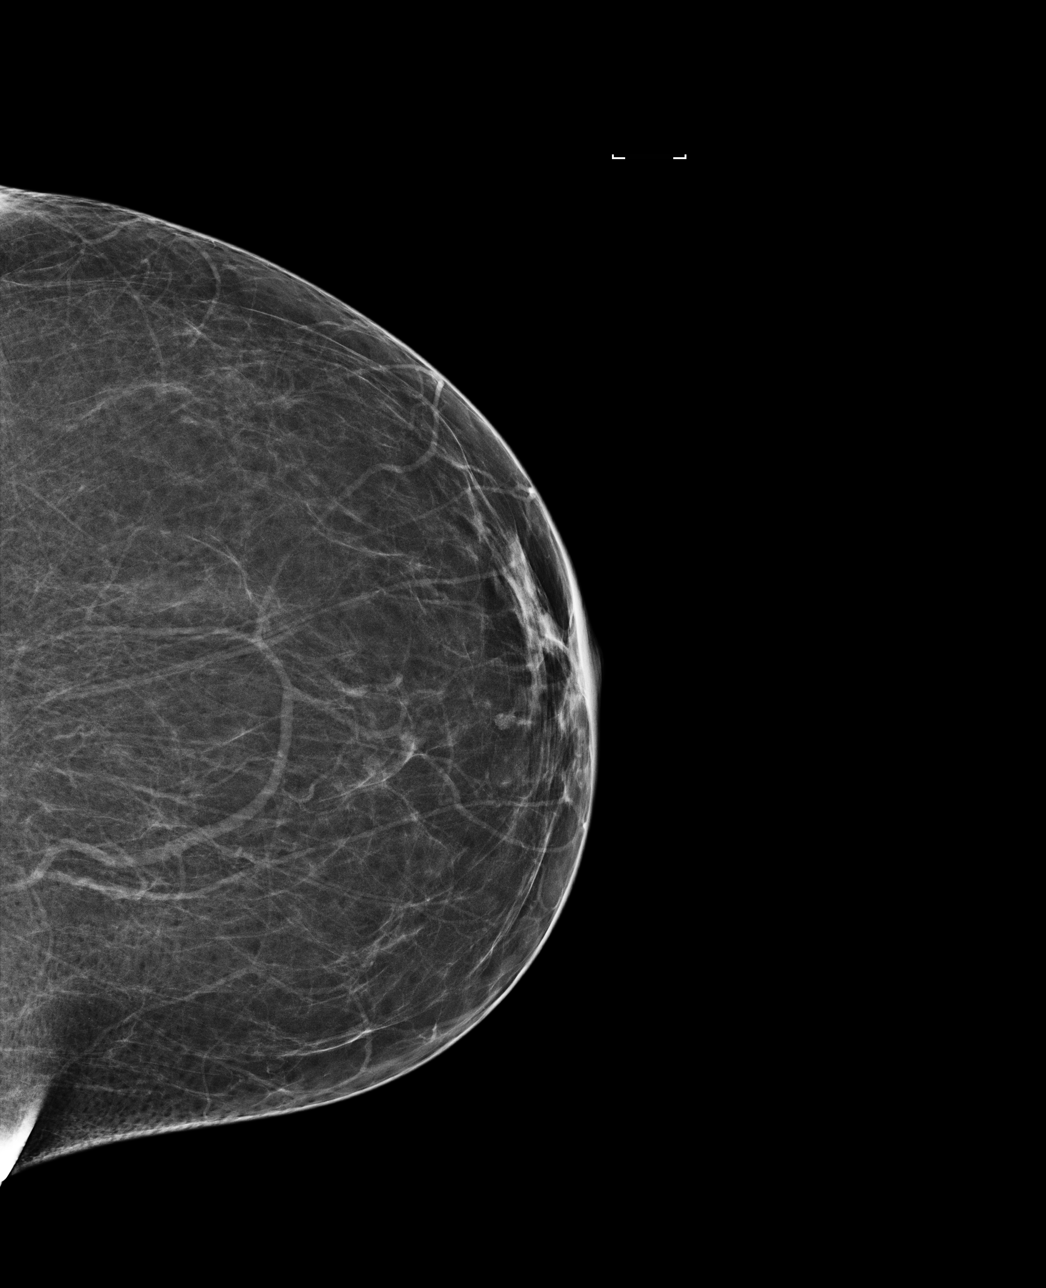

[R MLO]
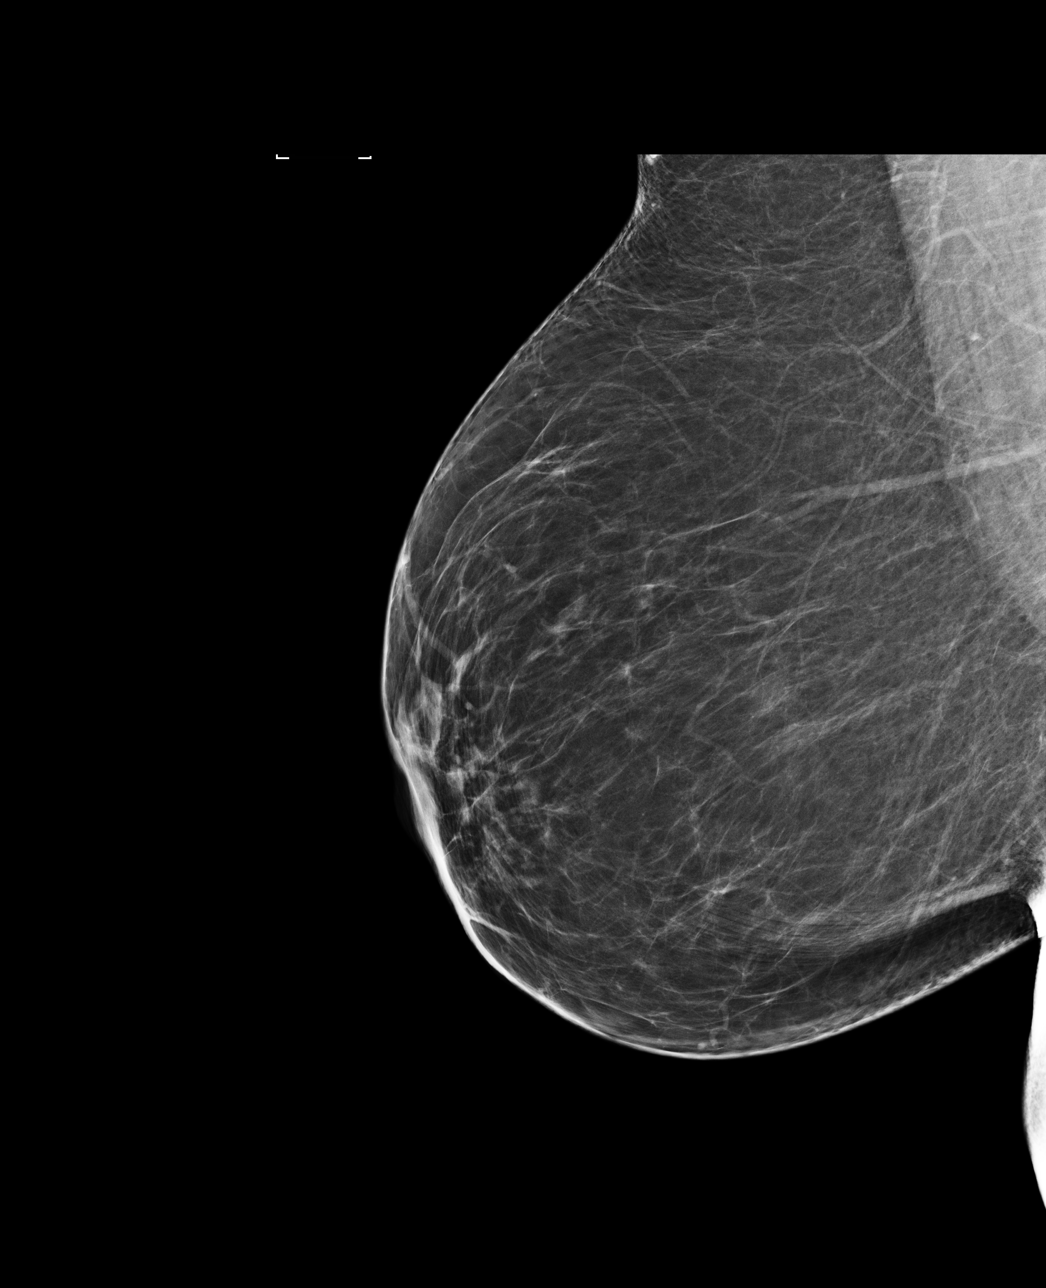

[R CC]
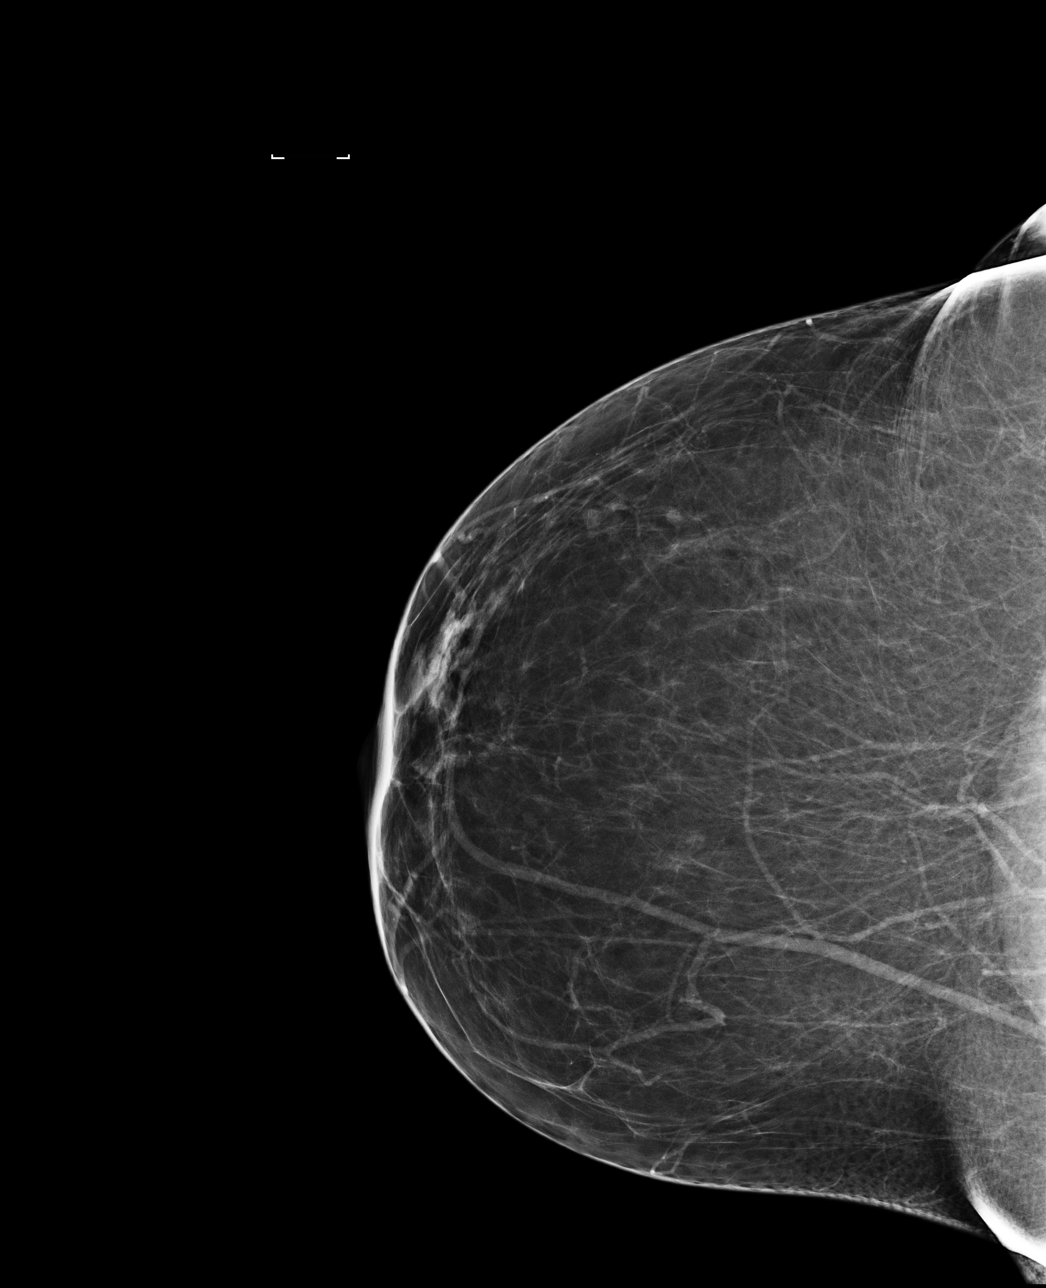

[L MLO]
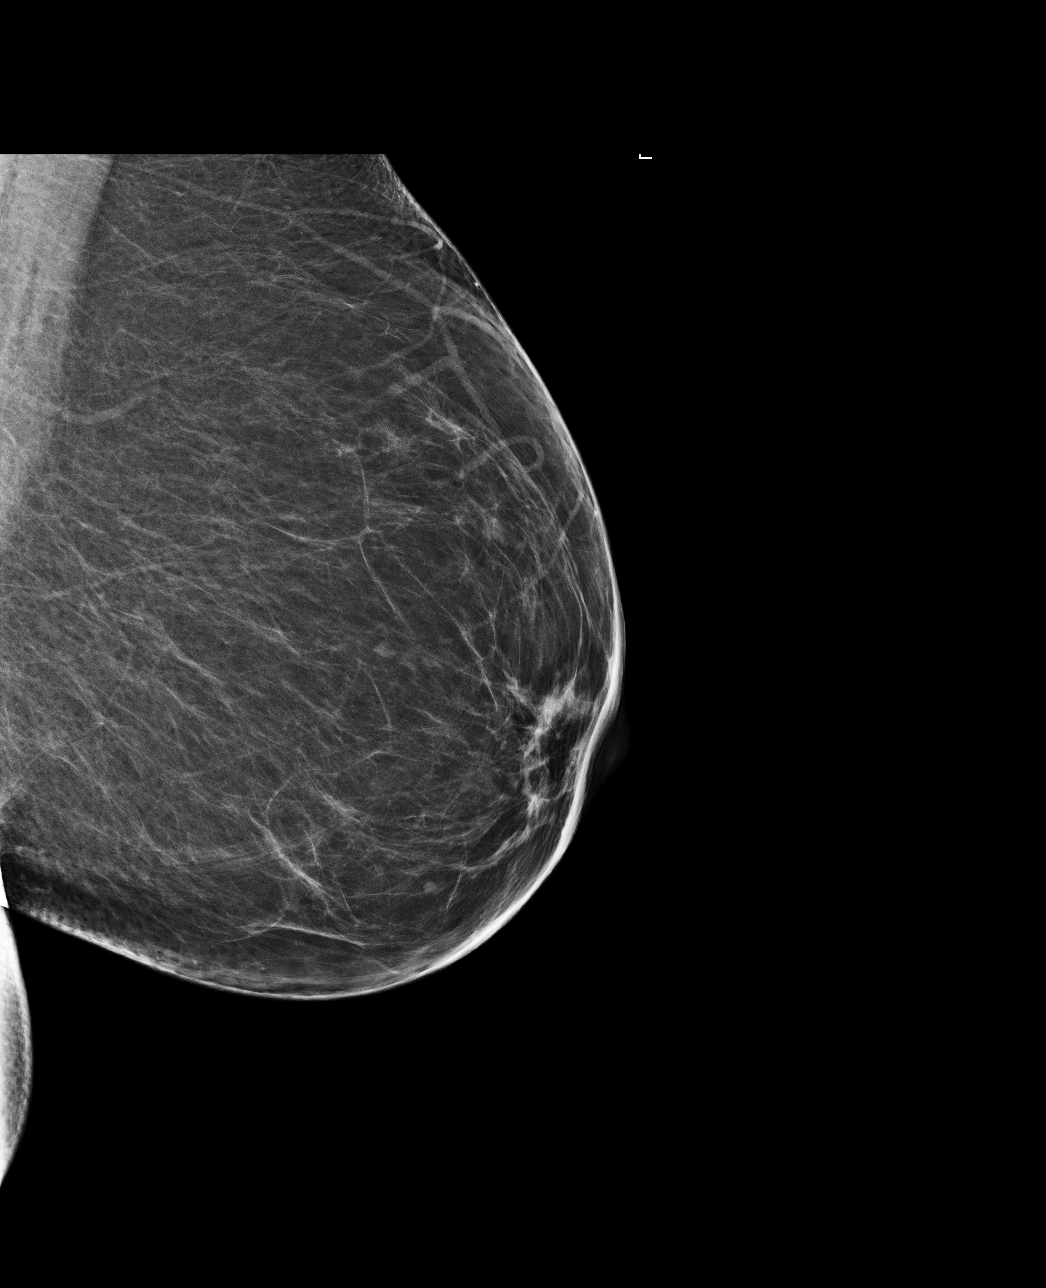

[4 of 4 positions shown; findings below may reference images not displayed]

ACR Breast Density Category b: There are scattered areas of
fibroglandular density.
FINDINGS: There are no findings suspicious for malignancy. Images were
processed with CAD.
IMPRESSION: No mammographic evidence of malignancy. A result letter of this
screening mammogram will be mailed directly to the patient.

RECOMMENDATION:
Screening mammogram in one year. (Code:AS-G-LCT)

BI-RADS CATEGORY  1: Negative.

## 2019-01-13 ENCOUNTER — Encounter: Payer: Self-pay | Admitting: Radiology

## 2019-02-25 ENCOUNTER — Other Ambulatory Visit: Payer: Self-pay | Admitting: Genetic Counselor

## 2019-02-25 DIAGNOSIS — Z803 Family history of malignant neoplasm of breast: Secondary | ICD-10-CM

## 2019-03-04 ENCOUNTER — Encounter: Payer: Self-pay | Admitting: Genetic Counselor

## 2019-03-04 ENCOUNTER — Inpatient Hospital Stay: Payer: Self-pay | Attending: Genetic Counselor | Admitting: Genetic Counselor

## 2019-03-04 ENCOUNTER — Inpatient Hospital Stay: Payer: Self-pay

## 2019-03-04 ENCOUNTER — Other Ambulatory Visit: Payer: Self-pay

## 2019-03-04 DIAGNOSIS — Z8042 Family history of malignant neoplasm of prostate: Secondary | ICD-10-CM

## 2019-03-04 DIAGNOSIS — Z803 Family history of malignant neoplasm of breast: Secondary | ICD-10-CM

## 2019-03-04 DIAGNOSIS — Z800 Family history of malignant neoplasm of digestive organs: Secondary | ICD-10-CM

## 2019-03-04 DIAGNOSIS — Z8481 Family history of carrier of genetic disease: Secondary | ICD-10-CM

## 2019-03-04 NOTE — Progress Notes (Signed)
REFERRING PROVIDER: No referring provider defined for this encounter.  PRIMARY PROVIDER:  Pleas Koch, NP  PRIMARY REASON FOR VISIT:  1. Family history of carrier of hereditary disease   2. Family history of breast cancer in mother   70. Family history of prostate cancer   4. Family history of stomach cancer      HISTORY OF PRESENT ILLNESS:   Brandi Cochran, a 53 y.o. female, was seen for a [place] cancer genetics consultation due to a family history of a mutation in the CHEK2 gene.  Brandi Cochran presents to clinic today to discuss the possibility of a hereditary predisposition to cancer, genetic testing, and to further clarify her future cancer risks, as well as potential cancer risks for family members.     Brandi Cochran is a 53 y.o. female with no personal history of cancer.    CANCER HISTORY:  Oncology History   No history exists.     RISK FACTORS:  Colonoscopy: yes; 3 tubular adenomas in 2019 - told to repeat in 3 years.  Past Medical History:  Diagnosis Date  . Family history of breast cancer   . Family history of carrier of hereditary disease   . Family history of prostate cancer   . Family history of stomach cancer   . Hyperlipidemia   . TMJ (dislocation of temporomandibular joint)     Past Surgical History:  Procedure Laterality Date  . COLONOSCOPY WITH PROPOFOL N/A 10/17/2017   Procedure: COLONOSCOPY WITH PROPOFOL;  Surgeon: Lin Landsman, MD;  Location: [facility] ENDOSCOPY;  Service: Endoscopy;  Laterality: N/A;  . ENDOMETRIAL ABLATION  2009-2010  . KNEE SURGERY Left   . TUBAL LIGATION     postpartum    Social History   Socioeconomic History  . Marital status: Divorced    Spouse name: Not on file  . Number of children: Not on file  . Years of education: Not on file  . Highest education level: Not on file  Occupational History  . Not on file  Social Needs  . Financial resource strain: Not on file  . Food insecurity    Worry: Not on file     Inability: Not on file  . Transportation needs    Medical: Not on file    Non-medical: Not on file  Tobacco Use  . Smoking status: Current Every Day Smoker  . Smokeless tobacco: Never Used  Substance and Sexual Activity  . Alcohol use: No    Frequency: Never  . Drug use: Not on file  . Sexual activity: Not on file  Lifestyle  . Physical activity    Days per week: Not on file    Minutes per session: Not on file  . Stress: Not on file  Relationships  . Social Herbalist on phone: Not on file    Gets together: Not on file    Attends religious service: Not on file    Active member of club or organization: Not on file    Attends meetings of clubs or organizations: Not on file    Relationship status: Not on file  Other Topics Concern  . Not on file  Social History Narrative   Married.   4 children, one grandchildren.   Works as a Engineer, [employer].    Enjoys doing puzzles, playing computer games, aromatherapy.      FAMILY HISTORY:  We obtained a detailed, 4-generation family history.  Significant diagnoses are listed below: Family History  Problem Relation Age of Onset  . Breast cancer Mother 32  . Hypertension Father   . Heart attack Father   . Prostate cancer Father 52  . Prostate cancer Brother 78       CHEK2+  . Cancer Maternal Grandfather        unknown, diagnosed in 61s  . Stomach cancer Paternal Grandmother 1   Brandi Cochran has one brother, Brandi Cochran, who is 2 and has a history of prostate cancer diagnosed at age 53 and skin cancer. She also has two sons.   Brandi Cochran's mother died at the age of 2 from breast cancer. Her maternal grandfather died from an unknown type of cancer in his 51s. Her maternal grandmother died at the age of 2 and did not have a history of cancer. There are no other known diagnosis of cancer on her maternal side of the family.  Brandi Cochran's father is currently living at the age of 17 and was diagnosed with  prostate cancer at age 40 as well as skin cancer. Her paternal grandmother died at the age of 64 from gastric cancer.  Brandi Cochran is aware of previous family history of genetic testing for hereditary cancer risks in her brother that found a CHEK2 mutation. Patient's maternal ancestors are of [place] descent, and paternal ancestors are of [place] and [place] descent. There is no reported Ashkenazi Jewish ancestry. There is no known consanguinity.  GENETIC COUNSELING ASSESSMENT: Brandi Cochran is a 53 y.o. 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 We, therefore, discussed and recommended the following at today's visit.   DISCUSSION: Brandi Cochran's brother had genetic testing that was positive for a mutation in the CHEK2 gene. Based on her brother's positive genetic testing results, Brandi Cochran has a 50% (1 in 2) chance to also have the CHEK2 mutation. We reviewed the cancer risks that are associated with CHEK2 mutations, including an increased risk of breast, colon, and prostate cancers. These estimated cancer risks vary widely and may be influenced by family history. Women with a CHEK2 deleterious mutation have approximately a 28% to 37% lifetime risk of breast cancer. Individuals with CHEK2 mutations may opt for increased cancer screening for associated cancer risks per the NCCN guidelines.   We reviewed the characteristics, features and inheritance patterns of hereditary cancer syndromes. We discussed that testing is beneficial for several reasons including knowing about other cancer risks, identifying potential screening and risk-reduction options that may be appropriate, and to understand if other family members could be at risk for cancer and allow them to undergo genetic testing.   We also discussed genetic testing, including the appropriate family members to test, the process of testing, insurance coverage  and turn-around-time for results. We discussed the implications of a negative, positive and/or variant of uncertain significant result. There are other genes that are known to increase the risk for breast cancer. These genes include BRCA1/2, ATM, PALB2, etc. Brandi Cochran felt that she would like to do genetic testing for this known familial variant, but she would not like to pursue testing of additional breast cancer genes. 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 Because her brother had genetic testing that identified a likely pathogenic variant through [facility], testing for Ms. Cunanan will be free of charge if carried out through [facility].   PLAN: After considering the  risks, benefits, and limitations, Brandi Cochran provided informed consent to pursue genetic testing and the blood sample was sent to [facility] for analysis of the CHEK2 gene. Results should be available within approximately two-three weeks' time, at which point they will be disclosed by telephone to Brandi Cochran, as will any additional recommendations warranted by these results. Brandi Cochran will receive a summary of her genetic counseling visit and a copy of her results once available. This information will also be available in Epic.   Brandi Cochran's questions were answered to her satisfaction today. Our contact information was provided should additional questions or concerns arise. Thank you for the referral and allowing [place] to share in the care of your patient.   Clint Guy, MS, [facility] Certified Genetic Counselor [email or web address] Phone: [phone]  The patient was seen for a total of 40 minutes in face-to-face genetic counseling.  This patient was discussed with Drs. Magrinat, Lindi Adie and/or Burr Medico who agrees with the above.     _______________________________________________________________________ For Office Staff:  Number of people involved in session: 1 Was an Intern/ student involved with case: no

## 2019-03-10 DIAGNOSIS — Z1379 Encounter for other screening for genetic and chromosomal anomalies: Secondary | ICD-10-CM | POA: Insufficient documentation

## 2019-03-11 ENCOUNTER — Ambulatory Visit: Payer: Self-pay | Admitting: Genetic Counselor

## 2019-03-11 ENCOUNTER — Telehealth: Payer: Self-pay | Admitting: Genetic Counselor

## 2019-03-11 DIAGNOSIS — Z1379 Encounter for other screening for genetic and chromosomal anomalies: Secondary | ICD-10-CM

## 2019-03-11 NOTE — Progress Notes (Signed)
HPI:  Ms. Lenig was previously seen in the [place] [facility] clinic due to a family history of a CHEK2 mutation and concerns regarding a hereditary predisposition to cancer. Please refer to our prior cancer genetics clinic note for more information regarding our discussion, assessment and recommendations, at the time. Ms. Mobley's recent genetic test results were disclosed to her, as were recommendations warranted by these results. These results and recommendations are discussed in more detail below.  CANCER HISTORY:  Oncology History   No history exists.    FAMILY HISTORY:  We obtained a detailed, 4-generation family history.  Significant diagnoses are listed below: Family History  Problem Relation Age of Onset  . Breast cancer Mother 46  . Hypertension Father   . Heart attack Father   . Prostate cancer Father 48  . Prostate cancer Brother 75       CHEK2+  . Cancer Maternal Grandfather        unknown, diagnosed in 46s  . Stomach cancer Paternal Grandmother 26     Ms. Peplinski has one brother, [name], who is 51 and has a history of prostate cancer diagnosed at age 69 and skin cancer. She also has two sons.   Ms. Denman's mother died at the age of 61 from breast cancer. Her maternal grandfather died from an unknown type of cancer in his 6s. Her maternal grandmother died at the age of 3 and did not have a history of cancer. There are no other known diagnosis of cancer on her maternal side of the family.  Ms. Cardenas's father is currently living at the age of 88 and was diagnosed with prostate cancer at age 55 as well as skin cancer. Her paternal grandmother died at the age of 83 from gastric cancer.  Ms. Nauman is aware of previous family history of genetic testing for hereditary cancer risks in her brother that found a CHEK2 mutation. Patient's maternal ancestors are of [place] descent, and paternal ancestors are of [place] and [place] descent. There is no  reported Ashkenazi Jewish ancestry. There is no known consanguinity.  GENETIC TEST RESULTS: We recommended Ms. Brandner pursue testing for the familial hereditary cancer gene mutation called CHEK2, [id number]. Ms. Blankenship's test was normal and did not reveal the familial mutation. We call this result a true negative result because a cancer-causing mutation was identified in Ms. Broadfoot's family, and she did not inherit it.  Although the result was negative, Ms. Walbert's chances of developing breast cancer is likely still elevated based on the family history. Her chances of developing other CHEK2-related cancers, such as colon cancer, are the same as they are in the general population.    The test report will be scanned into EPIC and located under the Molecular Pathology section of the Results Review tab.  A portion of the result report is included below for reference.     ADDITIONAL GENETIC TESTING: We discussed with Ms. Altamirano that there are other genes that are associated with increased cancer risk that can be analyzed. Should Ms. Blumer wish to pursue additional genetic testing, we are happy to discuss and coordinate this testing, at any time.    CANCER SCREENING RECOMMENDATIONS: While reassuring, this negative result does not definitively rule out a hereditary predisposition to cancer.  There could be genetic mutations in genes that have not been tested or identified to increase cancer risk.  Therefore, it is recommended she continue to follow the cancer management and screening guidelines provided by her  primary healthcare provider.   An individual's cancer risk and medical management are not determined by genetic test results alone. Overall cancer risk assessment incorporates additional factors, including personal medical history, family history, and any available genetic information that may result in a personalized plan for cancer prevention and surveillance  Based on Ms.  Knoblock's family of cancer, as well as her genetic test results, a statistical model Midwife) was used to estimate her risk of developing breast. These estimate her lifetime risk of developing breast cancer to be approximately 20.6%.  This lifetime breast cancer risk is a preliminary estimate based on available information using one of several models endorsed by the [employer] (ACS). The ACS recommends consideration of breast MRI screening as an adjunct to mammography for patients at high risk (defined as 20% or greater lifetime risk). A more detailed breast cancer risk assessment can be considered, if clinically indicated.    Inputs used for model:  Age: 53  Weight: 197  Height: 5'7"  Menarche: 11  Parity: 25  Menopause: 52  HRT: never  Breast biopsy: no prior biopsy  Family history: mother - breast cancer age 6  Ms. Steward has been determined to be at high risk for breast cancer.  Therefore, we recommend that she considers annual breast MRI in addition to annual screening with mammography.  We discussed that Ms. Jakes should discuss her individual situation with her physician and determine a breast cancer screening plan with which they are both comfortable.  Ms. Rupnow declined a referral to the high-risk breast clinic at this time.  RECOMMENDATIONS FOR FAMILY MEMBERS:  Individuals in this family might be at some increased risk of developing cancer, over the general population risk, simply due to the family history of cancer.  We recommended women in this family have a yearly mammogram beginning at age 60, or 71 years younger than the earliest onset of cancer, an annual clinical breast exam, and perform monthly breast self-exams. Women in this family should also have a gynecological exam as recommended by their primary provider. All family members should have a colonoscopy by age 54.  FOLLOW-UP: Lastly, we discussed with Ms. Scorza that cancer genetics  is a rapidly advancing field and it is possible that new genetic tests will be appropriate for her and/or her family members in the future. We encouraged her to remain in contact with cancer genetics on an annual basis so we can update her personal and family histories and let her know of advances in cancer genetics that may benefit this family.   Our contact number was provided. Ms. Governale's questions were answered to her satisfaction, and she knows she is welcome to call us at anytime with additional questions or concerns.   Clint Guy, MS, [facility] Certified Genetic Counselor [email or web address] Phone: [phone]

## 2019-03-11 NOTE — Telephone Encounter (Signed)
Disclosed negative genetic testing results for the familial CHEK2 variant that was identified in her brother. There are other genes associated with breast cancer that we could look at if Brandi Cochran is interested, but she declined at this time. She does qualify for high risk breast screening, but declined a referral to the high risk breast clinic at this time. Brandi Cochran has found it challenging to have a mammogram every year and she would find it difficult to add more screening.

## 2019-03-12 ENCOUNTER — Encounter: Payer: Self-pay | Admitting: Genetic Counselor

## 2019-11-10 ENCOUNTER — Other Ambulatory Visit: Payer: Self-pay | Admitting: Obstetrics and Gynecology

## 2019-11-10 DIAGNOSIS — Z1231 Encounter for screening mammogram for malignant neoplasm of breast: Secondary | ICD-10-CM

## 2019-11-11 DIAGNOSIS — Z1231 Encounter for screening mammogram for malignant neoplasm of breast: Secondary | ICD-10-CM | POA: Diagnosis not present

## 2019-11-11 DIAGNOSIS — F17200 Nicotine dependence, unspecified, uncomplicated: Secondary | ICD-10-CM | POA: Diagnosis not present

## 2019-11-11 DIAGNOSIS — R7303 Prediabetes: Secondary | ICD-10-CM | POA: Diagnosis not present

## 2019-11-11 DIAGNOSIS — E785 Hyperlipidemia, unspecified: Secondary | ICD-10-CM | POA: Diagnosis not present

## 2019-11-11 DIAGNOSIS — Z6835 Body mass index (BMI) 35.0-35.9, adult: Secondary | ICD-10-CM | POA: Diagnosis not present

## 2019-11-11 DIAGNOSIS — Z0000 Encounter for general adult medical examination without abnormal findings: Secondary | ICD-10-CM | POA: Diagnosis not present

## 2019-11-18 DIAGNOSIS — R7303 Prediabetes: Secondary | ICD-10-CM | POA: Diagnosis not present

## 2019-11-18 DIAGNOSIS — R1013 Epigastric pain: Secondary | ICD-10-CM | POA: Diagnosis not present

## 2019-11-18 DIAGNOSIS — K529 Noninfective gastroenteritis and colitis, unspecified: Secondary | ICD-10-CM | POA: Diagnosis not present

## 2019-11-18 DIAGNOSIS — Z79899 Other long term (current) drug therapy: Secondary | ICD-10-CM | POA: Diagnosis not present

## 2019-11-18 DIAGNOSIS — R197 Diarrhea, unspecified: Secondary | ICD-10-CM | POA: Diagnosis not present

## 2019-11-18 DIAGNOSIS — Z6838 Body mass index (BMI) 38.0-38.9, adult: Secondary | ICD-10-CM | POA: Diagnosis not present

## 2019-11-18 DIAGNOSIS — R1032 Left lower quadrant pain: Secondary | ICD-10-CM | POA: Diagnosis not present

## 2019-11-18 DIAGNOSIS — F17210 Nicotine dependence, cigarettes, uncomplicated: Secondary | ICD-10-CM | POA: Diagnosis not present

## 2019-11-18 DIAGNOSIS — R112 Nausea with vomiting, unspecified: Secondary | ICD-10-CM | POA: Diagnosis not present

## 2019-11-18 DIAGNOSIS — E669 Obesity, unspecified: Secondary | ICD-10-CM | POA: Diagnosis not present

## 2019-11-18 DIAGNOSIS — K6389 Other specified diseases of intestine: Secondary | ICD-10-CM | POA: Diagnosis not present

## 2019-12-16 DIAGNOSIS — M25561 Pain in right knee: Secondary | ICD-10-CM | POA: Diagnosis not present

## 2020-05-15 DIAGNOSIS — L821 Other seborrheic keratosis: Secondary | ICD-10-CM | POA: Diagnosis not present

## 2020-05-15 DIAGNOSIS — L820 Inflamed seborrheic keratosis: Secondary | ICD-10-CM | POA: Diagnosis not present

## 2020-05-15 DIAGNOSIS — L814 Other melanin hyperpigmentation: Secondary | ICD-10-CM | POA: Diagnosis not present

## 2020-05-15 DIAGNOSIS — D485 Neoplasm of uncertain behavior of skin: Secondary | ICD-10-CM | POA: Diagnosis not present

## 2020-05-16 DIAGNOSIS — Z1231 Encounter for screening mammogram for malignant neoplasm of breast: Secondary | ICD-10-CM | POA: Diagnosis not present

## 2020-05-23 DIAGNOSIS — C44729 Squamous cell carcinoma of skin of left lower limb, including hip: Secondary | ICD-10-CM | POA: Diagnosis not present

## 2020-05-23 DIAGNOSIS — D485 Neoplasm of uncertain behavior of skin: Secondary | ICD-10-CM | POA: Diagnosis not present

## 2020-05-23 DIAGNOSIS — L814 Other melanin hyperpigmentation: Secondary | ICD-10-CM | POA: Diagnosis not present

## 2020-07-05 DIAGNOSIS — C44729 Squamous cell carcinoma of skin of left lower limb, including hip: Secondary | ICD-10-CM | POA: Diagnosis not present

## 2020-07-10 DIAGNOSIS — C44729 Squamous cell carcinoma of skin of left lower limb, including hip: Secondary | ICD-10-CM | POA: Diagnosis not present

## 2020-07-10 DIAGNOSIS — D0472 Carcinoma in situ of skin of left lower limb, including hip: Secondary | ICD-10-CM | POA: Diagnosis not present

## 2021-04-19 DIAGNOSIS — Z13220 Encounter for screening for lipoid disorders: Secondary | ICD-10-CM | POA: Diagnosis not present

## 2021-04-19 DIAGNOSIS — E785 Hyperlipidemia, unspecified: Secondary | ICD-10-CM | POA: Diagnosis not present

## 2021-04-19 DIAGNOSIS — M26609 Unspecified temporomandibular joint disorder, unspecified side: Secondary | ICD-10-CM | POA: Diagnosis not present

## 2021-04-19 DIAGNOSIS — Z0000 Encounter for general adult medical examination without abnormal findings: Secondary | ICD-10-CM | POA: Diagnosis not present

## 2021-04-19 DIAGNOSIS — R7303 Prediabetes: Secondary | ICD-10-CM | POA: Diagnosis not present

## 2022-04-25 DIAGNOSIS — R7303 Prediabetes: Secondary | ICD-10-CM | POA: Diagnosis not present

## 2022-04-25 DIAGNOSIS — E785 Hyperlipidemia, unspecified: Secondary | ICD-10-CM | POA: Diagnosis not present

## 2022-04-25 DIAGNOSIS — Z0000 Encounter for general adult medical examination without abnormal findings: Secondary | ICD-10-CM | POA: Diagnosis not present

## 2022-04-25 DIAGNOSIS — E669 Obesity, unspecified: Secondary | ICD-10-CM | POA: Diagnosis not present

## 2022-06-14 DIAGNOSIS — K5730 Diverticulosis of large intestine without perforation or abscess without bleeding: Secondary | ICD-10-CM | POA: Diagnosis not present

## 2022-06-14 DIAGNOSIS — Z86010 Personal history of colonic polyps: Secondary | ICD-10-CM | POA: Diagnosis not present

## 2022-06-14 DIAGNOSIS — Z1211 Encounter for screening for malignant neoplasm of colon: Secondary | ICD-10-CM | POA: Diagnosis not present

## 2022-06-14 DIAGNOSIS — K641 Second degree hemorrhoids: Secondary | ICD-10-CM | POA: Diagnosis not present

## 2022-06-14 DIAGNOSIS — D123 Benign neoplasm of transverse colon: Secondary | ICD-10-CM | POA: Diagnosis not present

## 2022-06-14 DIAGNOSIS — E669 Obesity, unspecified: Secondary | ICD-10-CM | POA: Diagnosis not present

## 2022-06-14 DIAGNOSIS — Z6831 Body mass index (BMI) 31.0-31.9, adult: Secondary | ICD-10-CM | POA: Diagnosis not present

## 2022-06-25 DIAGNOSIS — Z1231 Encounter for screening mammogram for malignant neoplasm of breast: Secondary | ICD-10-CM | POA: Diagnosis not present

## 2023-04-01 DIAGNOSIS — Z6834 Body mass index (BMI) 34.0-34.9, adult: Secondary | ICD-10-CM | POA: Diagnosis not present

## 2023-04-01 DIAGNOSIS — Z0000 Encounter for general adult medical examination without abnormal findings: Secondary | ICD-10-CM | POA: Diagnosis not present

## 2023-04-01 DIAGNOSIS — E66811 Obesity, class 1: Secondary | ICD-10-CM | POA: Diagnosis not present

## 2023-04-01 DIAGNOSIS — E6609 Other obesity due to excess calories: Secondary | ICD-10-CM | POA: Diagnosis not present

## 2023-04-01 DIAGNOSIS — R059 Cough, unspecified: Secondary | ICD-10-CM | POA: Diagnosis not present

## 2023-04-01 DIAGNOSIS — R7303 Prediabetes: Secondary | ICD-10-CM | POA: Diagnosis not present

## 2023-04-01 DIAGNOSIS — Z1330 Encounter for screening examination for mental health and behavioral disorders, unspecified: Secondary | ICD-10-CM | POA: Diagnosis not present

## 2023-04-01 DIAGNOSIS — E785 Hyperlipidemia, unspecified: Secondary | ICD-10-CM | POA: Diagnosis not present

## 2023-05-19 DIAGNOSIS — D229 Melanocytic nevi, unspecified: Secondary | ICD-10-CM | POA: Diagnosis not present

## 2023-05-19 DIAGNOSIS — L989 Disorder of the skin and subcutaneous tissue, unspecified: Secondary | ICD-10-CM | POA: Diagnosis not present

## 2023-05-19 DIAGNOSIS — Z85828 Personal history of other malignant neoplasm of skin: Secondary | ICD-10-CM | POA: Diagnosis not present

## 2023-05-19 DIAGNOSIS — L570 Actinic keratosis: Secondary | ICD-10-CM | POA: Diagnosis not present

## 2023-05-19 DIAGNOSIS — Z1283 Encounter for screening for malignant neoplasm of skin: Secondary | ICD-10-CM | POA: Diagnosis not present
